# Patient Record
Sex: Female | Born: 1955 | Race: White | Hispanic: No | Marital: Married
Health system: Southern US, Community
[De-identification: ages and names within clinical notes are randomized; demographics above are authoritative.]

## PROBLEM LIST (undated history)

## (undated) DIAGNOSIS — R9431 Abnormal electrocardiogram [ECG] [EKG]: Secondary | ICD-10-CM

## (undated) DIAGNOSIS — I219 Acute myocardial infarction, unspecified: Secondary | ICD-10-CM

---

## 2007-06-13 ENCOUNTER — Ambulatory Visit: Payer: Self-pay

## 2007-06-27 ENCOUNTER — Ambulatory Visit: Payer: Self-pay

## 2008-08-08 ENCOUNTER — Ambulatory Visit: Payer: Self-pay

## 2009-08-14 ENCOUNTER — Ambulatory Visit: Payer: Self-pay | Admitting: Urology

## 2014-03-09 ENCOUNTER — Emergency Department (HOSPITAL_COMMUNITY)
Admission: EM | Admit: 2014-03-09 | Discharge: 2014-03-09 | Disposition: A | Discharge status: Home / Self Care | Payer: TRICARE For Life (TFL) | Attending: Emergency Medicine | Admitting: Emergency Medicine

## 2014-03-09 ENCOUNTER — Encounter (HOSPITAL_COMMUNITY): Payer: Self-pay | Admitting: Emergency Medicine

## 2014-03-09 ENCOUNTER — Emergency Department (HOSPITAL_COMMUNITY): Payer: TRICARE For Life (TFL)

## 2014-03-09 DIAGNOSIS — I252 Old myocardial infarction: Secondary | ICD-10-CM | POA: Diagnosis not present

## 2014-03-09 DIAGNOSIS — R079 Chest pain, unspecified: Secondary | ICD-10-CM | POA: Insufficient documentation

## 2014-03-09 DIAGNOSIS — R002 Palpitations: Secondary | ICD-10-CM | POA: Insufficient documentation

## 2014-03-09 DIAGNOSIS — Z880 Allergy status to penicillin: Secondary | ICD-10-CM | POA: Insufficient documentation

## 2014-03-09 DIAGNOSIS — Z79899 Other long term (current) drug therapy: Secondary | ICD-10-CM | POA: Diagnosis not present

## 2014-03-09 HISTORY — DX: Acute myocardial infarction, unspecified: I21.9

## 2014-03-09 HISTORY — DX: Abnormal electrocardiogram (ECG) (EKG): R94.31

## 2014-03-09 LAB — CBC WITH DIFFERENTIAL/PLATELET
BASOS PCT: 1 % (ref 0–1)
Basophils Absolute: 0 10*3/uL (ref 0.0–0.1)
EOS ABS: 0.2 10*3/uL (ref 0.0–0.7)
Eosinophils Relative: 3 % (ref 0–5)
HCT: 41.2 % (ref 36.0–46.0)
Hemoglobin: 13.8 g/dL (ref 12.0–15.0)
Lymphocytes Relative: 36 % (ref 12–46)
Lymphs Abs: 2.2 10*3/uL (ref 0.7–4.0)
MCH: 29.7 pg (ref 26.0–34.0)
MCHC: 33.5 g/dL (ref 30.0–36.0)
MCV: 88.8 fL (ref 78.0–100.0)
Monocytes Absolute: 0.5 10*3/uL (ref 0.1–1.0)
Monocytes Relative: 7 % (ref 3–12)
NEUTROS PCT: 53 % (ref 43–77)
Neutro Abs: 3.3 10*3/uL (ref 1.7–7.7)
Platelets: 328 10*3/uL (ref 150–400)
RBC: 4.64 MIL/uL (ref 3.87–5.11)
RDW: 13.2 % (ref 11.5–15.5)
WBC: 6.2 10*3/uL (ref 4.0–10.5)

## 2014-03-09 LAB — I-STAT CHEM 8, ED
BUN: 16 mg/dL (ref 6–23)
CALCIUM ION: 1.12 mmol/L — AB (ref 1.13–1.30)
CREATININE: 0.7 mg/dL (ref 0.50–1.10)
Chloride: 102 mEq/L (ref 96–112)
Glucose, Bld: 104 mg/dL — ABNORMAL HIGH (ref 70–99)
HCT: 44 % (ref 36.0–46.0)
Hemoglobin: 15 g/dL (ref 12.0–15.0)
Potassium: 3.6 mmol/L (ref 3.5–5.1)
Sodium: 142 mmol/L (ref 135–145)
TCO2: 25 mmol/L (ref 0–100)

## 2014-03-09 LAB — I-STAT TROPONIN, ED
Troponin i, poc: 0 ng/mL (ref 0.00–0.08)
Troponin i, poc: 0 ng/mL (ref 0.00–0.08)

## 2014-03-09 LAB — I-STAT CG4 LACTIC ACID, ED: LACTIC ACID, VENOUS: 0.9 mmol/L (ref 0.5–2.2)

## 2014-03-09 NOTE — ED Provider Notes (Signed)
CSN: [id number]     Arrival date & time 03/09/14  1107 History   First MD Initiated Contact with Patient 03/09/14 1127     Chief Complaint  Patient presents with  . Chest Pain     (Consider location/radiation/quality/duration/timing/severity/associated sxs/prior Treatment) Patient is a 59 y.o. female presenting with chest pain. The history is provided by the patient.  Chest Pain Associated symptoms: palpitations   Associated symptoms: no abdominal pain, no back pain, no headache, no nausea, no numbness, no shortness of breath, not vomiting and no weakness    patient presents with left-sided chest pain. She states that it is sharp and stabbing. It is improved with nitroglycerin. She is feeling much better now. States that she had a heart attack when she was 59 years old. She had positive troponins but no other intervention at that time. She's had echoes and stress test since and has not had any cardiac abnormalities. She states she has episodes of palpitations and is on Holter monitor currently. Patient does not smoke. She states that this pain started sharp in the front and went to the back and a little bit up to her neck.  Past Medical History  Diagnosis Date  . MI (myocardial infarction)     "When I was 24"  . EKG abnormalities     Short PR Interval   History reviewed. No pertinent past surgical history. No family history on file. History  Substance Use Topics  . Smoking status: Not on file  . Smokeless tobacco: Not on file  . Alcohol Use: Not on file   OB History    No data available     Review of Systems  Constitutional: Negative for activity change and appetite change.  Eyes: Negative for pain.  Respiratory: Negative for chest tightness and shortness of breath.   Cardiovascular: Positive for chest pain and palpitations. Negative for leg swelling.  Gastrointestinal: Negative for nausea, vomiting, abdominal pain and diarrhea.  Genitourinary: Negative for flank pain.   Musculoskeletal: Negative for back pain and neck stiffness.  Skin: Negative for rash.  Neurological: Negative for weakness, numbness and headaches.  Psychiatric/Behavioral: Negative for behavioral problems.      Allergies  Other; Bee venom; Ceclor; Keflex; Pamelor; Penicillins; Percocet; Phenergan; and Codeine  Home Medications   Prior to Admission medications   Medication Sig Start Date End Date Taking? Authorizing Provider  B Complex-C (B-COMPLEX WITH VITAMIN C) tablet Take 1 tablet by mouth daily.   Yes Historical Provider, MD  calcium carbonate (OS-CAL) 600 MG TABS tablet Take 600-2,400 mg by mouth 2 (two) times daily with a meal. For blood drive   Yes Historical Provider, MD  diltiazem (DILACOR XR) 180 MG 24 hr capsule Take 180 mg by mouth daily.   Yes Historical Provider, MD  hydrochlorothiazide (HYDRODIURIL) 25 MG tablet Take 25 mg by mouth daily.   Yes Historical Provider, MD  ibuprofen (ADVIL,MOTRIN) 200 MG tablet Take 800 mg by mouth 2 (two) times daily as needed.   Yes Historical Provider, MD  potassium chloride SA (K-DUR,KLOR-CON) 20 MEQ tablet Take 20 mEq by mouth daily.   Yes Historical Provider, MD   BP 113/57 mmHg  Pulse 73  Temp(Src) 98 F (36.7 C) (Oral)  Resp 23  SpO2 95% Physical Exam  Constitutional: She is oriented to person, place, and time. She appears well-developed and well-nourished.  HENT:  Head: Normocephalic and atraumatic.  Eyes: EOM are normal. Pupils are equal, round, and reactive to light.  Neck: Normal  range of motion. Neck supple.  Cardiovascular: Normal rate, regular rhythm and normal heart sounds.   No murmur heard. Pulmonary/Chest: Effort normal and breath sounds normal. No respiratory distress. She has no wheezes. She has no rales. She exhibits no tenderness.  Abdominal: Soft. Bowel sounds are normal. She exhibits no distension. There is no tenderness. There is no rebound and no guarding.  Musculoskeletal: Normal range of motion.   Neurological: She is alert and oriented to person, place, and time. No cranial nerve deficit.  Skin: Skin is warm and dry.  Psychiatric: She has a normal mood and affect. Her speech is normal.  Nursing note and vitals reviewed.   ED Course  Procedures (including critical care time) Labs Review Labs Reviewed  I-STAT CHEM 8, ED - Abnormal; Notable for the following:    Glucose, Bld 104 (*)    Calcium, Ion 1.12 (*)    All other components within normal limits  CBC WITH DIFFERENTIAL  I-STAT TROPOININ, ED  I-STAT TROPOININ, ED  I-STAT CG4 LACTIC ACID, ED    Imaging Review No results found.   EKG Interpretation   Date/Time:  Friday March 09 2014 11:30:58 EST Ventricular Rate:  69 PR Interval:  108 QRS Duration: 82 QT Interval:  386 QTC Calculation: 413 R Axis:   37 Text Interpretation:  Sinus rhythm with short PR Otherwise normal ECG    MDM   Final diagnoses:  Chest pain    Chest pain. EKG and labs reassuring. Trop negative x2. Will d/c    Juliet Rude. Rubin Payor, MD 03/12/14 1328

## 2014-03-09 NOTE — ED Notes (Signed)
Cup of ice water given to patient with permission from Dr. Rubin PayorPickering

## 2014-03-09 NOTE — Discharge Instructions (Signed)

## 2014-03-09 NOTE — ED Notes (Signed)
Pt comfortable with discharge and follow up instructions. Pt declines wheelchair, escorted to waiting area by this RN. No Prescriptions 

## 2014-03-09 NOTE — ED Notes (Signed)
[facility]/o left side cp that radiates to back, neck and left shoulder. Denies any N/V or SOB. Pt did have some diaphoresis that lasted approximately 15mins. ASA 324mg  administered prior to EMS arrival. EMS administered Nitro x1. After Nitro pt has been pain free since. No change on 12 lead. Pt has cardiologist in [place]. Hx of MI when she was 59 years old. Sinus arrhythmia on the monitor.

## 2014-03-09 NOTE — ED Notes (Signed)
Pt returned from X-ray.  

## 2014-03-09 NOTE — ED Provider Notes (Signed)
1600 - Care from Dr. Rubin PayorPickering. Awaiting 2nd troponin. 2nd troponin normal. Stable for discharge.  1. Chest pain      Deborah MochaBlair Jillana Selph, MD 03/09/14 [id number]

## 2015-11-03 IMAGING — DX DG CHEST 2V
2 series · 2 of 2 positions shown · non-contrast
Comparison: None.

CLINICAL DATA: Left-sided chest pain with radiation to back.
Symptoms for 1 day. Cough and nasal congestion for 2 weeks.
Hypertension. Initial encounter.

EXAM:
CHEST  2 VIEW

[chest pa]
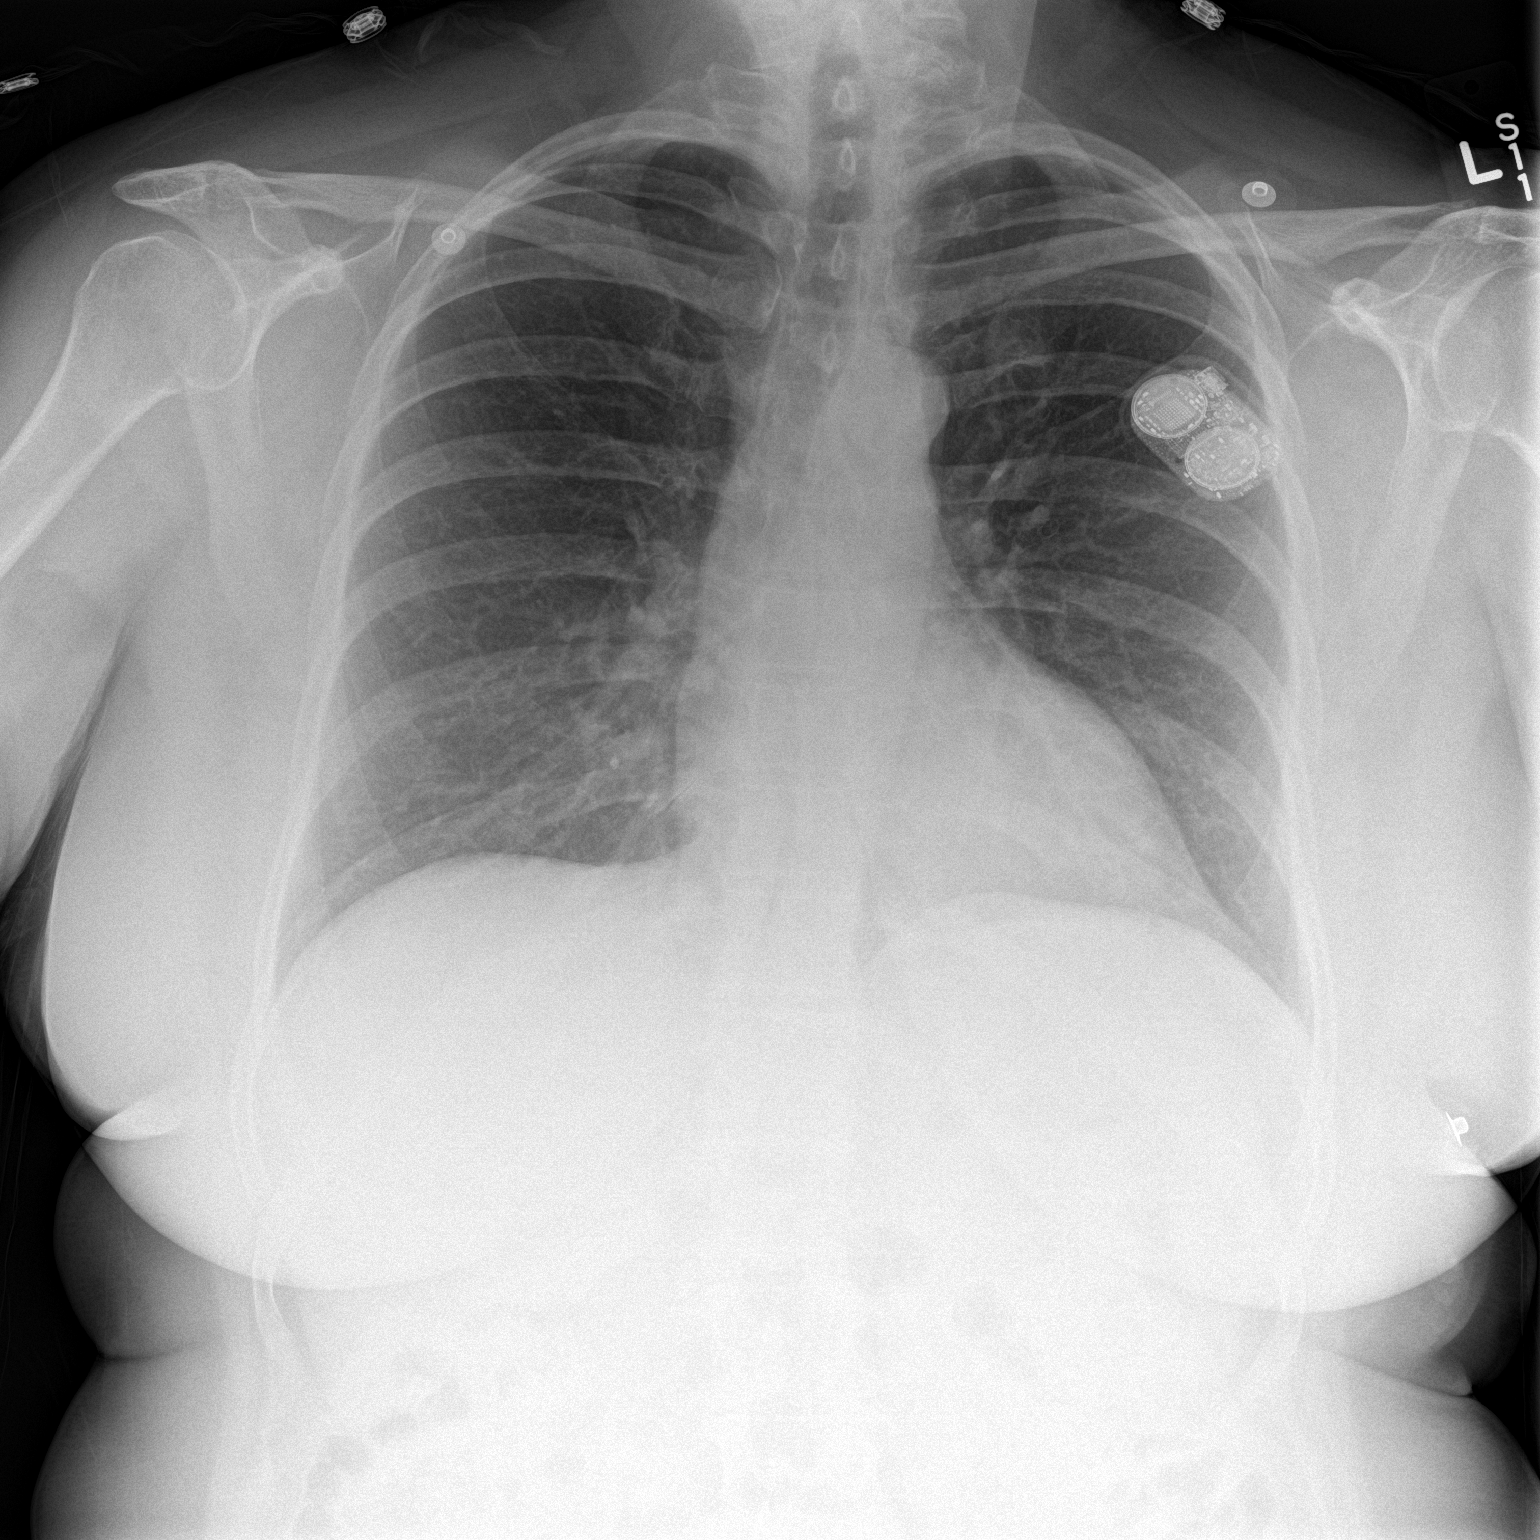

[chest lat]
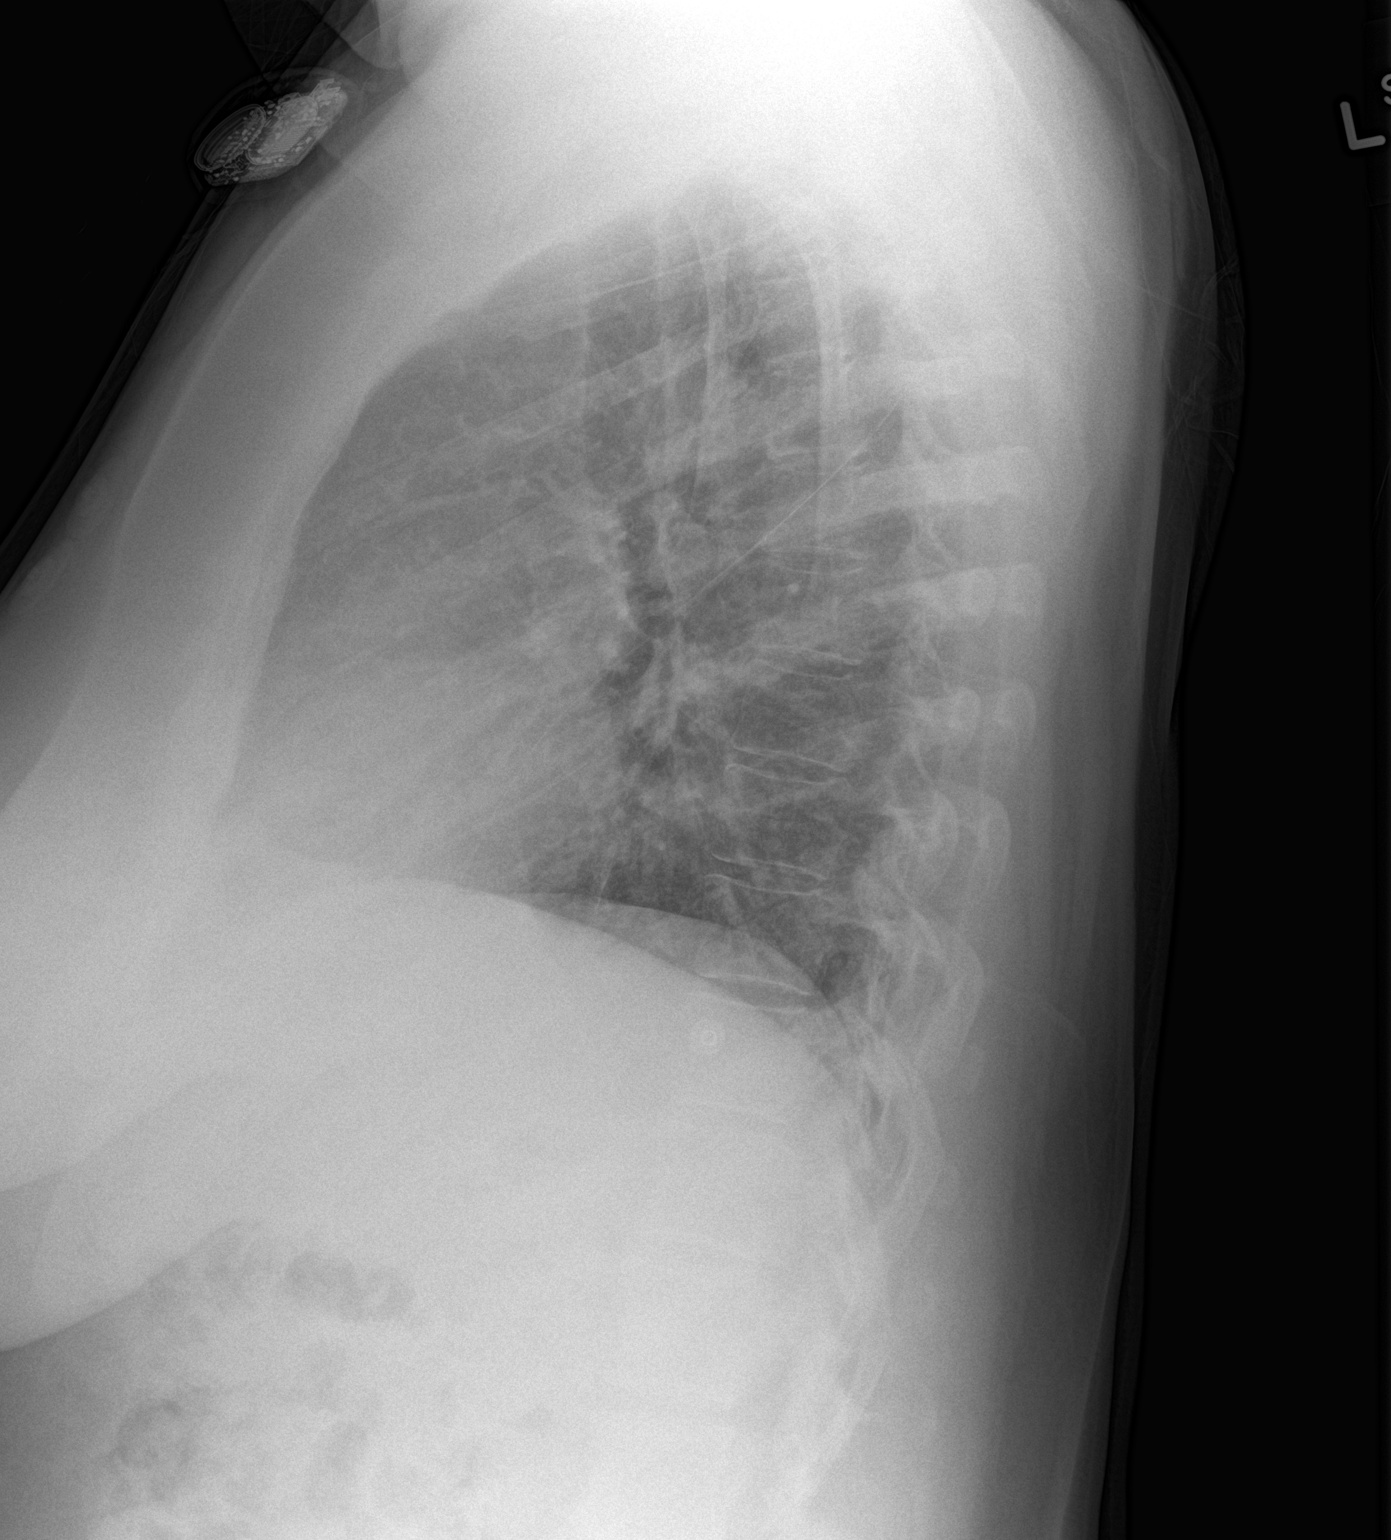

[2 of 2 positions shown; findings below may reference images not displayed]

FINDINGS: Loop monitor projecting over the anterior left chest superiorly.
Midline trachea. Normal heart size. Atherosclerosis in the
transverse aorta. No pleural effusion or pneumothorax. Clear lungs.
IMPRESSION: No acute cardiopulmonary disease.

Aortic atherosclerosis.
